# Patient Record
Sex: Male | Born: 1980 | Race: White | Hispanic: No | Marital: Married | Smoking: Never smoker
Health system: Southern US, Community
[De-identification: ages and names within clinical notes are randomized; demographics above are authoritative.]

## PROBLEM LIST (undated history)

## (undated) HISTORY — PX: HERNIA REPAIR: SHX51

## (undated) HISTORY — PX: TONSILLECTOMY: SUR1361

---

## 2016-03-17 DIAGNOSIS — F4321 Adjustment disorder with depressed mood: Secondary | ICD-10-CM | POA: Diagnosis not present

## 2016-04-14 DIAGNOSIS — F4321 Adjustment disorder with depressed mood: Secondary | ICD-10-CM | POA: Diagnosis not present

## 2016-05-19 DIAGNOSIS — F4321 Adjustment disorder with depressed mood: Secondary | ICD-10-CM | POA: Diagnosis not present

## 2016-06-02 DIAGNOSIS — F4321 Adjustment disorder with depressed mood: Secondary | ICD-10-CM | POA: Diagnosis not present

## 2016-07-06 DIAGNOSIS — F4321 Adjustment disorder with depressed mood: Secondary | ICD-10-CM | POA: Diagnosis not present

## 2016-07-28 DIAGNOSIS — F4321 Adjustment disorder with depressed mood: Secondary | ICD-10-CM | POA: Diagnosis not present

## 2016-08-19 ENCOUNTER — Emergency Department (HOSPITAL_BASED_OUTPATIENT_CLINIC_OR_DEPARTMENT_OTHER)
Admission: EM | Admit: 2016-08-19 | Discharge: 2016-08-19 | Disposition: A | Discharge status: Home / Self Care | Payer: BLUE CROSS/BLUE SHIELD | Attending: Emergency Medicine | Admitting: Emergency Medicine

## 2016-08-19 ENCOUNTER — Emergency Department (HOSPITAL_BASED_OUTPATIENT_CLINIC_OR_DEPARTMENT_OTHER): Payer: BLUE CROSS/BLUE SHIELD

## 2016-08-19 ENCOUNTER — Encounter (HOSPITAL_BASED_OUTPATIENT_CLINIC_OR_DEPARTMENT_OTHER): Payer: Self-pay | Admitting: *Deleted

## 2016-08-19 DIAGNOSIS — R110 Nausea: Secondary | ICD-10-CM | POA: Diagnosis not present

## 2016-08-19 DIAGNOSIS — R51 Headache: Secondary | ICD-10-CM | POA: Diagnosis not present

## 2016-08-19 DIAGNOSIS — R519 Headache, unspecified: Secondary | ICD-10-CM

## 2016-08-19 DIAGNOSIS — G43909 Migraine, unspecified, not intractable, without status migrainosus: Secondary | ICD-10-CM | POA: Diagnosis present

## 2016-08-19 MED ORDER — ACETAMINOPHEN 500 MG PO TABS
1000.0000 mg | ORAL_TABLET | Freq: Once | ORAL | Status: AC
  Administered 2016-08-19: 1000 mg via ORAL
  Filled 2016-08-19: qty 2

## 2016-08-19 NOTE — ED Provider Notes (Signed)
[facility] Provider Note   CSN: [id number] Arrival date & time: 08/19/16  1651     History   Chief Complaint Chief Complaint  Patient presents with  . Migraine    HPI Christian Borgerding is a 35 y.o. male.  Patient presents with headache. He states for the last 3 weeks she's had almost daily headaches. It typically starts in the afternoon and gets worse during the evening hours. It usually goes away on its own or with Tylenol. Sometimes it goes away after he goes to sleep at night. He describes it as a throbbing pain to his frontal region. Occasionally goes to the posterior head area. He denies any associated vision changes. No nasal congestion. No neck pain or stiffness. No recent head trauma. He has had some mild nausea but no vomiting. No vision changes. No history of significant headaches in the past although about a year ago he did have what he says was a full-blown migraine. He states that since he's had these headaches, he has episodes of dj vu. He feels like he's reliving parts of his life.  He denies any numbness or weakness to his extremities. No ataxia.    Migraine  Associated symptoms include headaches. Pertinent negatives include no chest pain, no abdominal pain and no shortness of breath.    History reviewed. No pertinent past medical history.  There are no active problems to display for this patient.   Past Surgical History:  Procedure Laterality Date  . HERNIA REPAIR    . TONSILLECTOMY         Home Medications    Prior to Admission medications   Not on File    Family History No family history on file.  Social History Social History  Substance Use Topics  . Smoking status: Never Smoker  . Smokeless tobacco: Never Used  . Alcohol use No     Allergies   Review of patient's allergies indicates no known allergies.   Review of Systems Review of Systems  Constitutional: Negative for chills, diaphoresis, fatigue and fever.  HENT:  Negative for congestion, rhinorrhea and sneezing.   Eyes: Negative.   Respiratory: Negative for cough, chest tightness and shortness of breath.   Cardiovascular: Negative for chest pain and leg swelling.  Gastrointestinal: Positive for nausea. Negative for abdominal pain, blood in stool, diarrhea and vomiting.  Genitourinary: Negative for difficulty urinating, flank pain, frequency and hematuria.  Musculoskeletal: Negative for arthralgias and back pain.  Skin: Negative for rash.  Neurological: Positive for headaches. Negative for dizziness, speech difficulty, weakness and numbness.     Physical Exam Updated Vital Signs BP (!) 154/103 (BP Location: Left Arm)   Pulse 78   Temp 97.6 F (36.4 C) (Oral)   Resp 18   Ht 6\' 2"  (1.88 m)   Wt 185 lb (83.9 kg)   SpO2 99%   BMI 23.75 kg/m   Physical Exam  Constitutional: He is oriented to person, place, and time. He appears well-developed and well-nourished.  HENT:  Head: Normocephalic and atraumatic.  Eyes: Pupils are equal, round, and reactive to light.  Neck: Normal range of motion. Neck supple.  Cardiovascular: Normal rate, regular rhythm and normal heart sounds.   Pulmonary/Chest: Effort normal and breath sounds normal. No respiratory distress. He has no wheezes. He has no rales. He exhibits no tenderness.  Abdominal: Soft. Bowel sounds are normal. There is no tenderness. There is no rebound and no guarding.  Musculoskeletal: Normal range of motion. He exhibits  no edema.  Lymphadenopathy:    He has no cervical adenopathy.  Neurological: He is alert and oriented to person, place, and time.  Motor 5 out of 5 all extremities, sensation grossly intact to light touch all extremities, finger-nose intact, no pronator drift, gait normal  Skin: Skin is warm and dry. No rash noted.  Psychiatric: He has a normal mood and affect.     ED Treatments / Results  Labs (all labs ordered are listed, but only abnormal results are displayed) Labs  Reviewed - No data to display  EKG  EKG Interpretation None       Radiology Ct Head Wo Contrast  Result Date: 08/19/2016 CLINICAL DATA:  Frequent headaches for a few weeks. Deja vu feeling. Nausea. EXAM: CT HEAD WITHOUT CONTRAST TECHNIQUE: Contiguous axial images were obtained from the base of the skull through the vertex without intravenous contrast. COMPARISON:  None. FINDINGS: Brain: There is no evidence of acute cortical infarct, intracranial hemorrhage, mass, midline shift, or extra-axial fluid collection. No hydrocephalus. Vascular: No hyperdense vessel or unexpected calcification. Skull: Normal. Negative for fracture or focal lesion. Sinuses/Orbits: Partially visualized mucous retention cysts in the maxillary sinuses. Clear mastoid air cells. Unremarkable orbits. Other: None. IMPRESSION: Unremarkable CT appearance of the brain. Electronically Signed   By: Sebastian AcheAllen  Grady M.D.   On: 08/19/2016 17:56    Procedures Procedures (including critical care time)  Medications Ordered in ED Medications  acetaminophen (TYLENOL) tablet 1,000 mg (1,000 mg Oral Given 08/19/16 1719)     Initial Impression / Assessment and Plan / ED Course  I have reviewed the triage vital signs and the nursing notes.  Pertinent labs & imaging results that were available during my care of the patient were reviewed by me and considered in my medical decision making (see chart for details).  Clinical Course    Patient presents with daily headaches for the last 3 weeks. He has waxing and waning symptoms. His symptoms are not consistent with subarachnoid hemorrhage or meningitis. His head CT is non-concerning. He is neurologically intact. He was discharged home in good condition. His blood pressure is mildly elevated. A recheck at discharge was 143/92. I encouraged him to monitor his blood pressures at home and follow-up with his PCP within the next week. Return precautions were given.  Final Clinical Impressions(s) /  ED Diagnoses   Final diagnoses:  Frequent headaches    New Prescriptions New Prescriptions   No medications on file     Rolan BuccoMelanie Elzina Devera, MD 08/19/16 1807

## 2016-08-19 NOTE — ED Notes (Signed)
Pt c/o frontal HA x 3 weeks, denies visual changes, denies N/V. Has not taken any meds for this. .Marland Kitchen

## 2016-08-19 NOTE — ED Triage Notes (Signed)
Headaches over the past 3 weeks. This is his first evaluation by a physician for the pain.

## 2016-08-24 DIAGNOSIS — Z23 Encounter for immunization: Secondary | ICD-10-CM | POA: Diagnosis not present

## 2016-08-24 DIAGNOSIS — R51 Headache: Secondary | ICD-10-CM | POA: Diagnosis not present

## 2016-09-08 DIAGNOSIS — F4321 Adjustment disorder with depressed mood: Secondary | ICD-10-CM | POA: Diagnosis not present

## 2016-10-05 DIAGNOSIS — F430 Acute stress reaction: Secondary | ICD-10-CM | POA: Diagnosis not present

## 2016-10-05 DIAGNOSIS — R51 Headache: Secondary | ICD-10-CM | POA: Diagnosis not present

## 2016-12-23 ENCOUNTER — Ambulatory Visit (HOSPITAL_COMMUNITY): Payer: BLUE CROSS/BLUE SHIELD | Admitting: Licensed Clinical Social Worker

## 2016-12-24 ENCOUNTER — Ambulatory Visit (INDEPENDENT_AMBULATORY_CARE_PROVIDER_SITE_OTHER): Payer: BLUE CROSS/BLUE SHIELD | Admitting: Licensed Clinical Social Worker

## 2016-12-24 DIAGNOSIS — Z630 Problems in relationship with spouse or partner: Secondary | ICD-10-CM | POA: Diagnosis not present
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
## 2017-04-05 DIAGNOSIS — J302 Other seasonal allergic rhinitis: Secondary | ICD-10-CM | POA: Diagnosis not present

## 2017-04-05 DIAGNOSIS — R51 Headache: Secondary | ICD-10-CM | POA: Diagnosis not present

## 2017-10-11 DIAGNOSIS — Z23 Encounter for immunization: Secondary | ICD-10-CM | POA: Diagnosis not present

## 2017-10-11 DIAGNOSIS — J029 Acute pharyngitis, unspecified: Secondary | ICD-10-CM | POA: Diagnosis not present

## 2017-10-11 DIAGNOSIS — J069 Acute upper respiratory infection, unspecified: Secondary | ICD-10-CM | POA: Diagnosis not present

## 2017-12-10 IMAGING — CT CT HEAD W/O CM
3 series · 15 of 47 positions shown, 18 images · non-contrast
Comparison: None.

CLINICAL DATA: Frequent headaches for a few weeks. Deja vu feeling.
Nausea.

EXAM:
CT HEAD WITHOUT CONTRAST
TECHNIQUE: Contiguous axial images were obtained from the base of the skull
through the vertex without intravenous contrast.

[Series 2: head wo · axial · 0.43mm/px · z∈[-158,-18]mm · 9 of 34 slices shown, 12 images]
[im 3/34  brain]
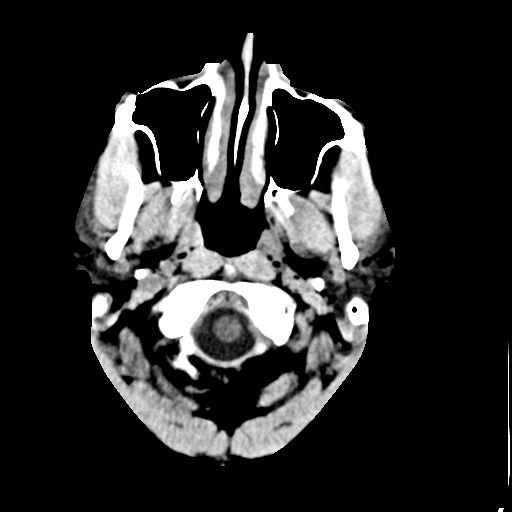
[im 3/34  bone]
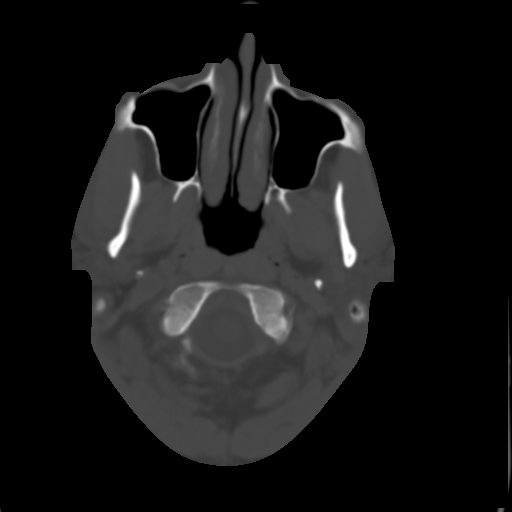
[im 6/34  brain]
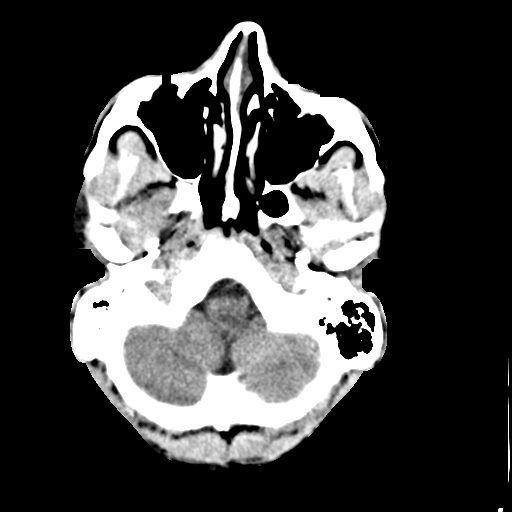
[im 10/34  brain]
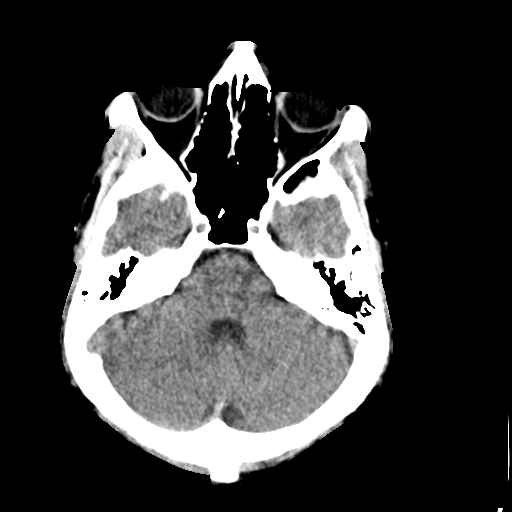
[im 13/34  brain]
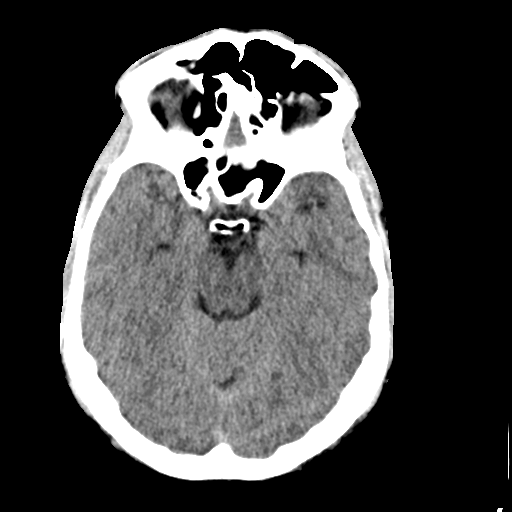
[im 18/34  brain]
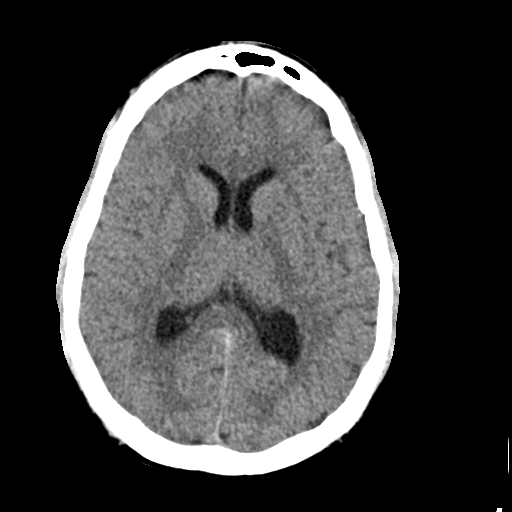
[im 18/34  bone]
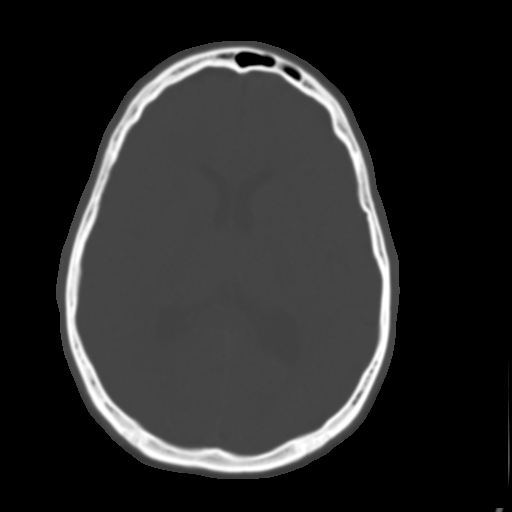
[im 21/34  brain]
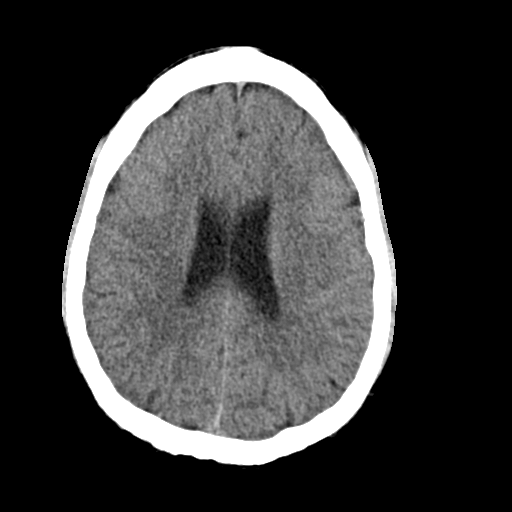
[im 24/34  brain]
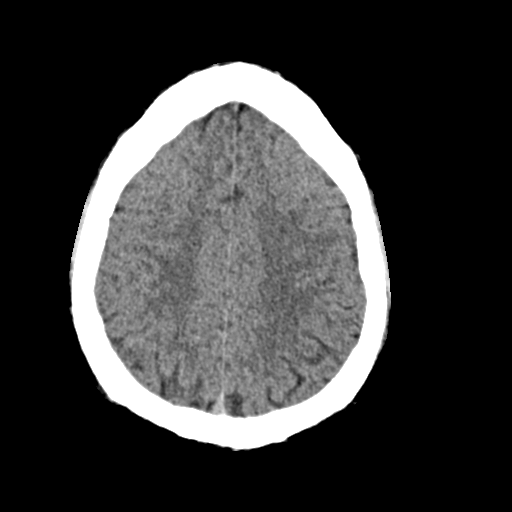
[im 28/34  brain]
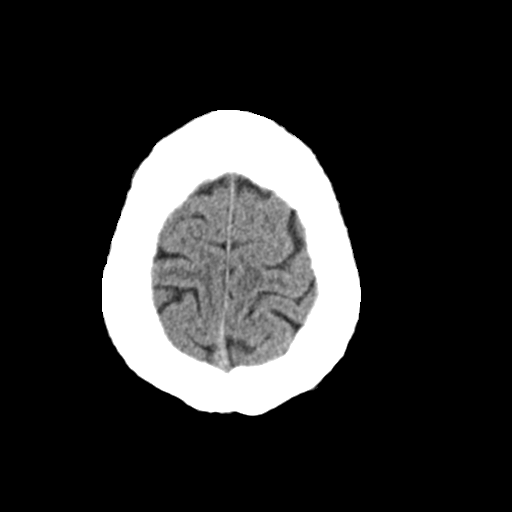
[im 31/34  brain]
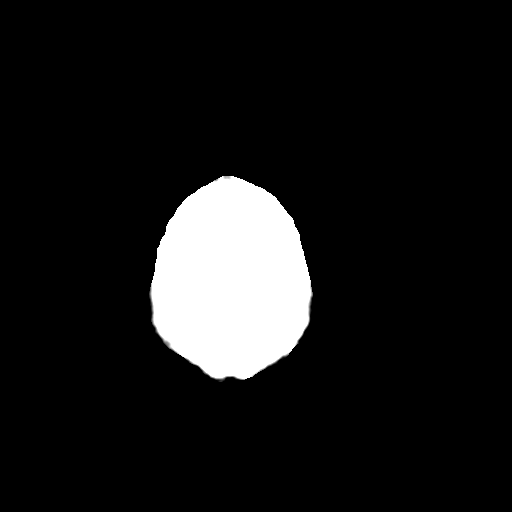
[im 31/34  bone]
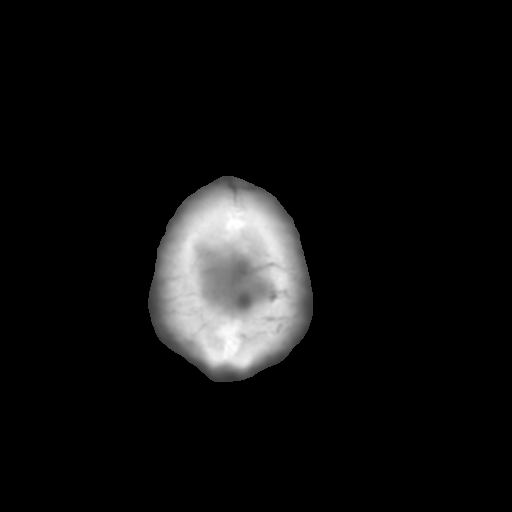

[Series 4: coronal soft · coronal · 0.34mm/px · 3 of 67 slices shown]
[im 23/67  brain]
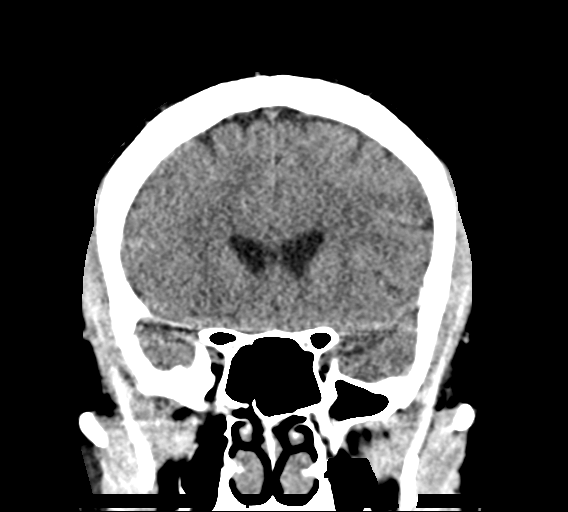
[im 30/67  brain]
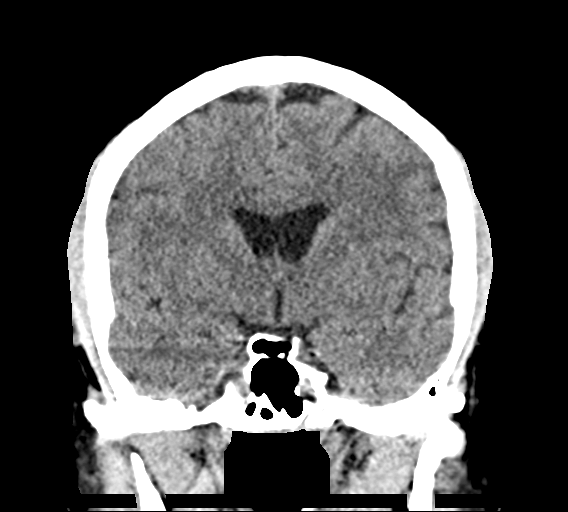
[im 37/67  brain]
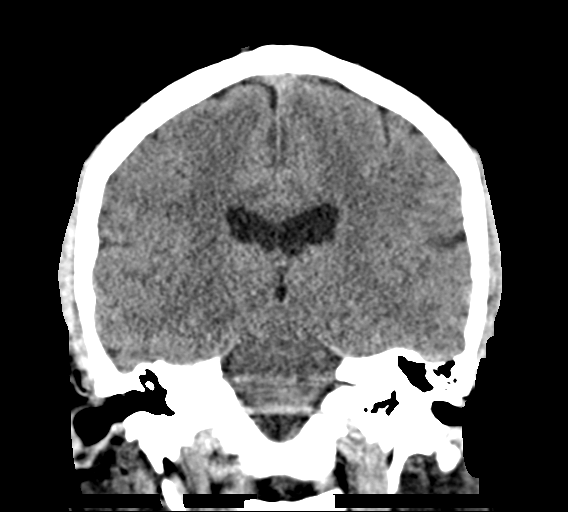

[Series 5: sag soft · sagittal · 0.33mm/px · 3 of 59 slices shown]
[im 20/59  brain]
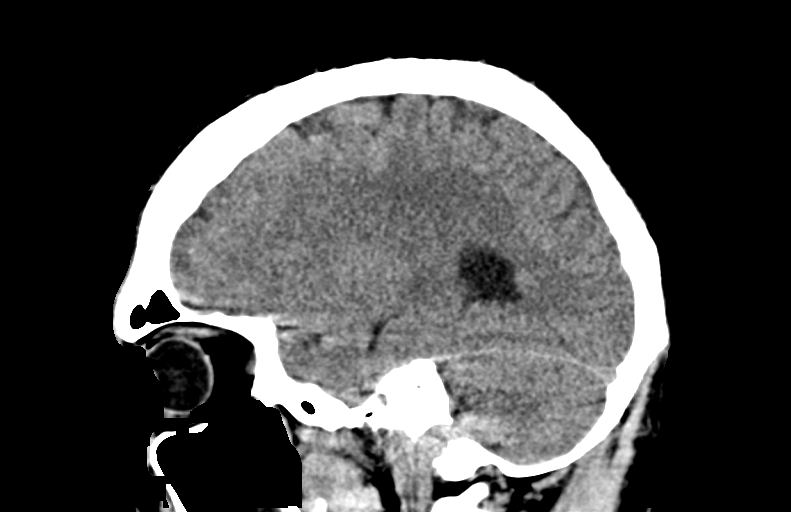
[im 30/59  brain]
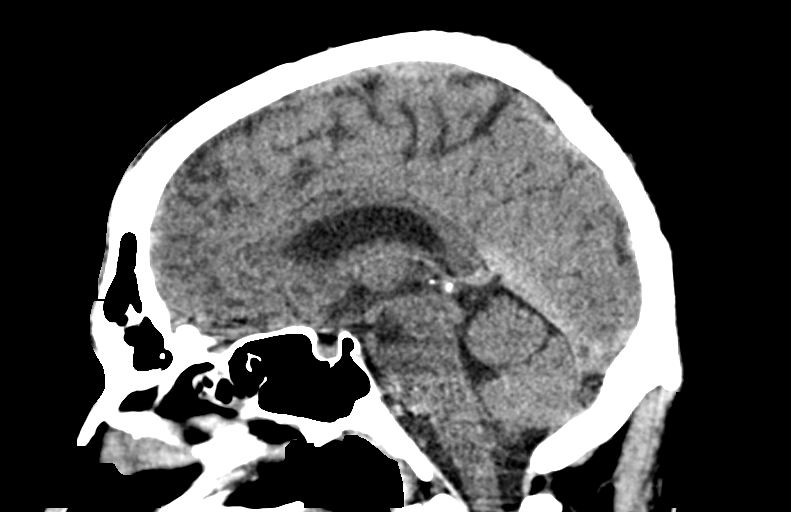
[im 39/59  brain]
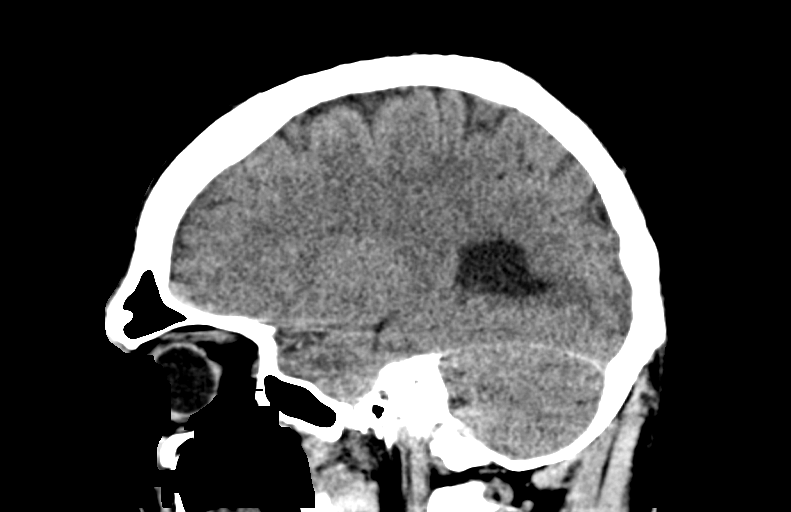

[15 of 47 positions shown; findings below may reference images not displayed]

FINDINGS: Brain: There is no evidence of acute cortical infarct, intracranial
hemorrhage, mass, midline shift, or extra-axial fluid collection. No
hydrocephalus.

Vascular: No hyperdense vessel or unexpected calcification.

Skull: Normal. Negative for fracture or focal lesion.

Sinuses/Orbits: Partially visualized mucous retention cysts in the
maxillary sinuses. Clear mastoid air cells. Unremarkable orbits.

Other: None.
IMPRESSION: Unremarkable CT appearance of the brain.

## 2018-05-03 DIAGNOSIS — F4321 Adjustment disorder with depressed mood: Secondary | ICD-10-CM | POA: Diagnosis not present

## 2018-05-24 DIAGNOSIS — F4321 Adjustment disorder with depressed mood: Secondary | ICD-10-CM | POA: Diagnosis not present

## 2018-07-06 DIAGNOSIS — F4321 Adjustment disorder with depressed mood: Secondary | ICD-10-CM | POA: Diagnosis not present

## 2018-08-17 DIAGNOSIS — F4321 Adjustment disorder with depressed mood: Secondary | ICD-10-CM | POA: Diagnosis not present

## 2018-09-06 DIAGNOSIS — F4321 Adjustment disorder with depressed mood: Secondary | ICD-10-CM | POA: Diagnosis not present

## 2018-09-21 DIAGNOSIS — F4321 Adjustment disorder with depressed mood: Secondary | ICD-10-CM | POA: Diagnosis not present

## 2018-10-19 DIAGNOSIS — F4321 Adjustment disorder with depressed mood: Secondary | ICD-10-CM | POA: Diagnosis not present

## 2018-11-23 DIAGNOSIS — F4321 Adjustment disorder with depressed mood: Secondary | ICD-10-CM | POA: Diagnosis not present

## 2018-12-18 DIAGNOSIS — F4321 Adjustment disorder with depressed mood: Secondary | ICD-10-CM | POA: Diagnosis not present

## 2019-02-15 DIAGNOSIS — F4321 Adjustment disorder with depressed mood: Secondary | ICD-10-CM | POA: Diagnosis not present

## 2019-04-18 DIAGNOSIS — F4321 Adjustment disorder with depressed mood: Secondary | ICD-10-CM | POA: Diagnosis not present

## 2019-08-31 DIAGNOSIS — M25561 Pain in right knee: Secondary | ICD-10-CM | POA: Diagnosis not present

## 2019-09-04 DIAGNOSIS — M25561 Pain in right knee: Secondary | ICD-10-CM | POA: Diagnosis not present

## 2019-09-06 DIAGNOSIS — M25561 Pain in right knee: Secondary | ICD-10-CM | POA: Diagnosis not present

## 2019-09-11 DIAGNOSIS — M25561 Pain in right knee: Secondary | ICD-10-CM | POA: Diagnosis not present

## 2019-09-13 DIAGNOSIS — M25561 Pain in right knee: Secondary | ICD-10-CM | POA: Diagnosis not present

## 2019-09-20 DIAGNOSIS — M25561 Pain in right knee: Secondary | ICD-10-CM | POA: Diagnosis not present

## 2019-09-26 DIAGNOSIS — M25561 Pain in right knee: Secondary | ICD-10-CM | POA: Diagnosis not present

## 2019-10-03 DIAGNOSIS — M25561 Pain in right knee: Secondary | ICD-10-CM | POA: Diagnosis not present

## 2019-10-10 DIAGNOSIS — M25561 Pain in right knee: Secondary | ICD-10-CM | POA: Diagnosis not present

## 2019-10-17 DIAGNOSIS — M25561 Pain in right knee: Secondary | ICD-10-CM | POA: Diagnosis not present

## 2019-11-04 DIAGNOSIS — S61012A Laceration without foreign body of left thumb without damage to nail, initial encounter: Secondary | ICD-10-CM | POA: Diagnosis not present
